# Patient Record
Sex: Male | Born: 1935 | Race: White | Hispanic: No | Marital: Married
Health system: Southern US, Community
[De-identification: ages and names within clinical notes are randomized; demographics above are authoritative.]

---

## 2004-05-24 ENCOUNTER — Ambulatory Visit: Payer: Self-pay | Admitting: Ophthalmology

## 2004-05-31 ENCOUNTER — Ambulatory Visit: Payer: Self-pay | Admitting: Ophthalmology

## 2005-05-19 ENCOUNTER — Ambulatory Visit: Payer: Self-pay | Admitting: Radiation Oncology

## 2005-11-28 ENCOUNTER — Ambulatory Visit: Payer: Self-pay | Admitting: Radiation Oncology

## 2005-12-18 ENCOUNTER — Ambulatory Visit: Payer: Self-pay | Admitting: Radiation Oncology

## 2007-07-03 ENCOUNTER — Ambulatory Visit: Payer: Self-pay | Admitting: Urology

## 2007-07-11 ENCOUNTER — Encounter (HOSPITAL_COMMUNITY): Admission: RE | Admit: 2007-07-11 | Discharge: 2007-10-09 | Payer: Self-pay | Admitting: Urology

## 2007-09-20 ENCOUNTER — Ambulatory Visit: Payer: Self-pay | Admitting: Oncology

## 2007-09-22 ENCOUNTER — Emergency Department: Payer: Self-pay | Admitting: Emergency Medicine

## 2007-09-23 ENCOUNTER — Other Ambulatory Visit: Payer: Self-pay

## 2007-10-19 ENCOUNTER — Ambulatory Visit: Payer: Self-pay | Admitting: Oncology

## 2007-11-19 ENCOUNTER — Ambulatory Visit: Payer: Self-pay | Admitting: Oncology

## 2007-12-19 ENCOUNTER — Ambulatory Visit: Payer: Self-pay | Admitting: Oncology

## 2008-01-19 ENCOUNTER — Ambulatory Visit: Payer: Self-pay | Admitting: Oncology

## 2008-02-19 ENCOUNTER — Ambulatory Visit: Payer: Self-pay | Admitting: Oncology

## 2008-04-03 ENCOUNTER — Ambulatory Visit: Payer: Self-pay | Admitting: Oncology

## 2008-04-20 ENCOUNTER — Ambulatory Visit: Payer: Self-pay | Admitting: Oncology

## 2008-05-20 ENCOUNTER — Ambulatory Visit: Payer: Self-pay | Admitting: Oncology

## 2008-06-20 ENCOUNTER — Ambulatory Visit: Payer: Self-pay | Admitting: Oncology

## 2008-07-21 ENCOUNTER — Ambulatory Visit: Payer: Self-pay | Admitting: Oncology

## 2008-08-18 ENCOUNTER — Ambulatory Visit: Payer: Self-pay | Admitting: Oncology

## 2008-09-18 ENCOUNTER — Ambulatory Visit: Payer: Self-pay | Admitting: Oncology

## 2008-10-18 ENCOUNTER — Ambulatory Visit: Payer: Self-pay | Admitting: Oncology

## 2008-11-18 ENCOUNTER — Ambulatory Visit: Payer: Self-pay | Admitting: Oncology

## 2008-12-18 ENCOUNTER — Ambulatory Visit: Payer: Self-pay | Admitting: Oncology

## 2009-01-18 ENCOUNTER — Ambulatory Visit: Payer: Self-pay | Admitting: Oncology

## 2009-01-22 ENCOUNTER — Ambulatory Visit: Payer: Self-pay | Admitting: Oncology

## 2009-02-01 ENCOUNTER — Inpatient Hospital Stay: Payer: Self-pay | Admitting: Internal Medicine

## 2009-02-18 ENCOUNTER — Ambulatory Visit: Payer: Self-pay | Admitting: Oncology

## 2009-03-20 ENCOUNTER — Ambulatory Visit: Payer: Self-pay | Admitting: Oncology

## 2009-04-06 IMAGING — NM NUCLEAR MEDICINE WHOLE BODY BONE SCINTIGRAPHY
2 series · 6 of 6 positions shown · non-contrast
Comparison: none

REASON FOR EXAM: Prostate CA
COMMENTS:

PROCEDURE:     NM  - NM BONE WB 3 HR [DATE]  [DATE]
RESULT:     The patient received an injection of 19.4 mCi of Technetium 99m
MDP. Anterior and posterior whole body images are augmented with oblique
views of the thoracolumbar region.

[Series 1000: 3 hr wholebody · 2.40mm/px · 2 of 2 frames shown]
[frame 1/2]
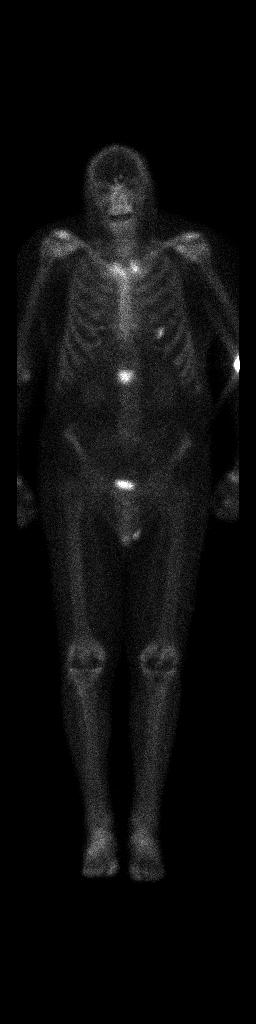
[frame 2/2]
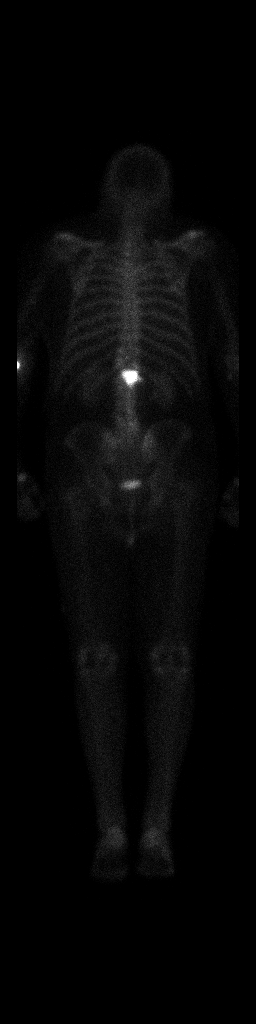

[Series 1000: statics · 2.40mm/px · 2 acquisitions, 4 frames shown]
[im 1/2]
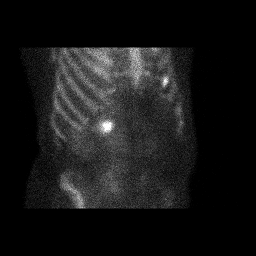
[im 1/2]
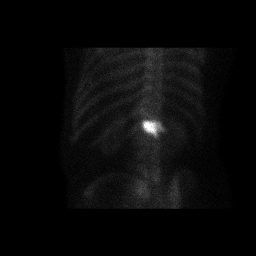
[im 2/2]
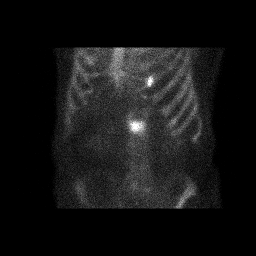
[im 2/2]
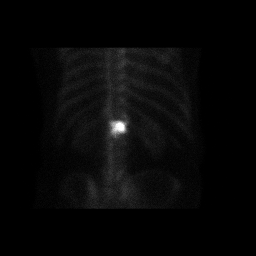

[6 of 6 positions shown; findings below may reference images not displayed]

FINDINGS: The study demonstrates increased localization in the L1 region and
in the costochondral area on the LEFT at approximately the LEFT, fifth rib.
Localization is seen in the sternoclavicular and acromioclavicular joints.
The patient appears to be status post bilateral knee arthroplasty. Uptake is
seen in the elbow, wrist, knee and ankle regions.
IMPRESSION: Abnormal localization in the L1 vertebral body region which
could be secondary to post traumatic fracture or prostate cancer metastatic
disease. There is also localization in the costovertebral region anteriorly
on the LEFT as described which is nonspecific as well. Correlation with
plain films of the lumbar spine is recommended. Degenerative changes and
post operative changes are noted in the knees with degenerative changes seen
in the ankles, shoulders, elbows and wrists.

## 2009-04-20 ENCOUNTER — Ambulatory Visit: Payer: Self-pay | Admitting: Oncology

## 2009-05-20 ENCOUNTER — Ambulatory Visit: Payer: Self-pay | Admitting: Oncology

## 2009-06-20 ENCOUNTER — Ambulatory Visit: Payer: Self-pay | Admitting: Oncology

## 2009-07-21 ENCOUNTER — Ambulatory Visit: Payer: Self-pay | Admitting: Oncology

## 2009-08-18 ENCOUNTER — Ambulatory Visit: Payer: Self-pay | Admitting: Oncology

## 2009-10-18 ENCOUNTER — Ambulatory Visit: Payer: Self-pay | Admitting: Oncology

## 2009-10-22 ENCOUNTER — Ambulatory Visit: Payer: Self-pay | Admitting: Oncology

## 2009-11-18 ENCOUNTER — Ambulatory Visit: Payer: Self-pay | Admitting: Oncology

## 2010-05-26 ENCOUNTER — Ambulatory Visit: Payer: Self-pay | Admitting: Oncology

## 2010-06-20 ENCOUNTER — Ambulatory Visit: Payer: Self-pay | Admitting: Oncology

## 2010-08-18 ENCOUNTER — Ambulatory Visit: Payer: Self-pay | Admitting: Oncology

## 2010-08-19 ENCOUNTER — Ambulatory Visit: Payer: Self-pay | Admitting: Oncology

## 2010-08-19 LAB — PSA

## 2010-08-29 ENCOUNTER — Emergency Department: Payer: Self-pay | Admitting: Emergency Medicine

## 2010-09-19 ENCOUNTER — Ambulatory Visit: Payer: Self-pay | Admitting: Oncology

## 2010-11-01 ENCOUNTER — Ambulatory Visit: Payer: Self-pay | Admitting: Oncology

## 2010-11-19 ENCOUNTER — Ambulatory Visit: Payer: Self-pay | Admitting: Oncology

## 2011-01-24 ENCOUNTER — Ambulatory Visit: Payer: Self-pay | Admitting: Oncology

## 2011-01-31 ENCOUNTER — Inpatient Hospital Stay: Payer: Self-pay | Admitting: Internal Medicine

## 2011-02-19 ENCOUNTER — Ambulatory Visit: Payer: Self-pay | Admitting: Oncology

## 2011-04-26 ENCOUNTER — Ambulatory Visit: Payer: Self-pay | Admitting: Oncology

## 2011-04-27 LAB — PSA: PSA: 3.9 ng/mL (ref 0.0–4.0)

## 2011-05-21 ENCOUNTER — Ambulatory Visit: Payer: Self-pay | Admitting: Oncology

## 2011-10-03 ENCOUNTER — Ambulatory Visit: Payer: Self-pay | Admitting: Oncology

## 2011-10-03 LAB — CBC CANCER CENTER
Eosinophil #: 0.1 x10 3/mm (ref 0.0–0.7)
Eosinophil %: 2 %
Lymphocyte %: 8.2 %
MCH: 28 pg (ref 26.0–34.0)
MCV: 88 fL (ref 80–100)
Neutrophil #: 4.4 x10 3/mm (ref 1.4–6.5)
Platelet: 164 x10 3/mm (ref 150–440)
RDW: 15.5 % — ABNORMAL HIGH (ref 11.5–14.5)
WBC: 5.5 x10 3/mm (ref 3.8–10.6)

## 2011-10-03 LAB — COMPREHENSIVE METABOLIC PANEL
Alkaline Phosphatase: 86 U/L (ref 50–136)
Chloride: 100 mmol/L (ref 98–107)
EGFR (African American): 38 — ABNORMAL LOW
EGFR (Non-African Amer.): 32 — ABNORMAL LOW
Osmolality: 285 (ref 275–301)
SGOT(AST): 16 U/L (ref 15–37)
SGPT (ALT): 18 U/L
Total Protein: 7 g/dL (ref 6.4–8.2)

## 2011-10-03 LAB — URINALYSIS, COMPLETE
Bacteria: NONE SEEN
Glucose,UR: NEGATIVE mg/dL (ref 0–75)
Hyaline Cast: 1
Ketone: NEGATIVE
Ph: 5 (ref 4.5–8.0)
Protein: NEGATIVE
Specific Gravity: 1.005 (ref 1.003–1.030)
Squamous Epithelial: NONE SEEN

## 2011-10-04 LAB — PSA: PSA: 0.5 ng/mL (ref 0.0–4.0)

## 2011-10-19 ENCOUNTER — Ambulatory Visit: Payer: Self-pay | Admitting: Oncology

## 2012-05-09 ENCOUNTER — Inpatient Hospital Stay: Payer: Self-pay | Admitting: Internal Medicine

## 2012-05-09 LAB — URINALYSIS, COMPLETE
Bacteria: NONE SEEN
Protein: NEGATIVE
Specific Gravity: 1.003 (ref 1.003–1.030)
Squamous Epithelial: NONE SEEN
WBC UR: NONE SEEN /HPF (ref 0–5)

## 2012-05-09 LAB — CBC
HCT: 33.9 % — ABNORMAL LOW (ref 40.0–52.0)
HGB: 11.2 g/dL — ABNORMAL LOW (ref 13.0–18.0)
RBC: 3.93 10*6/uL — ABNORMAL LOW (ref 4.40–5.90)
WBC: 7.9 10*3/uL (ref 3.8–10.6)

## 2012-05-09 LAB — COMPREHENSIVE METABOLIC PANEL
BUN: 30 mg/dL — ABNORMAL HIGH (ref 7–18)
Bilirubin,Total: 0.4 mg/dL (ref 0.2–1.0)
Chloride: 97 mmol/L — ABNORMAL LOW (ref 98–107)
Co2: 27 mmol/L (ref 21–32)
EGFR (Non-African Amer.): 30 — ABNORMAL LOW
Potassium: 4 mmol/L (ref 3.5–5.1)
SGOT(AST): 23 U/L (ref 15–37)
Total Protein: 7.1 g/dL (ref 6.4–8.2)

## 2012-05-09 LAB — TROPONIN I: Troponin-I: <0.02 ng/mL

## 2012-05-10 LAB — LIPID PANEL
Cholesterol: 99 mg/dL (ref 0–200)
Ldl Cholesterol, Calc: 42 mg/dL (ref 0–100)
Triglycerides: 92 mg/dL (ref 0–200)

## 2012-06-02 IMAGING — CT CT HEAD WITHOUT CONTRAST
2 series · 16 of 30 positions shown, 20 images · non-contrast
Comparison: none

REASON FOR EXAM: halucinations ams
COMMENTS:

[Series 2: without · axial · non-contrast · 0.44mm/px · z∈[+17,+142]mm · 13 of 31 slices shown, 17 images]
[im 3/31  brain]
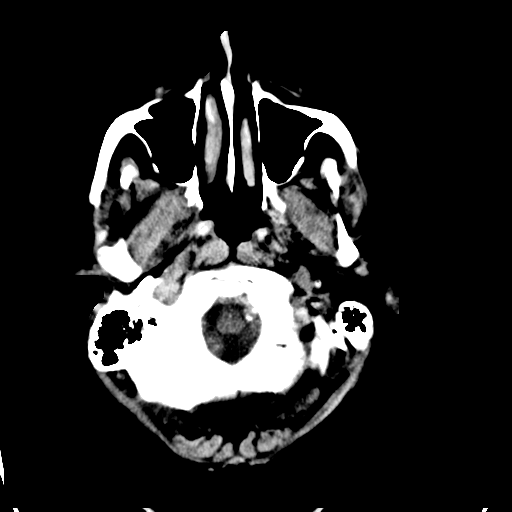
[im 3/31  bone]
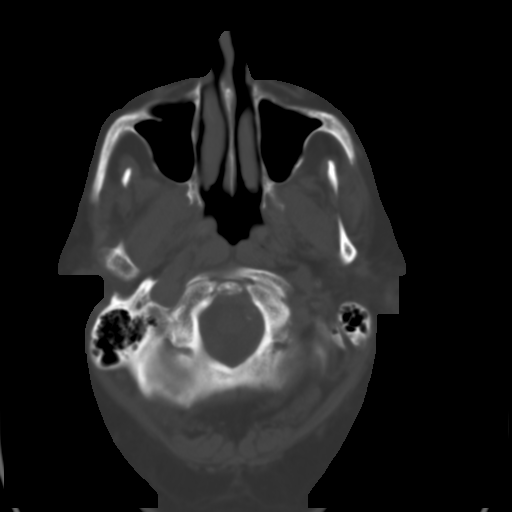
[im 5/31  brain]
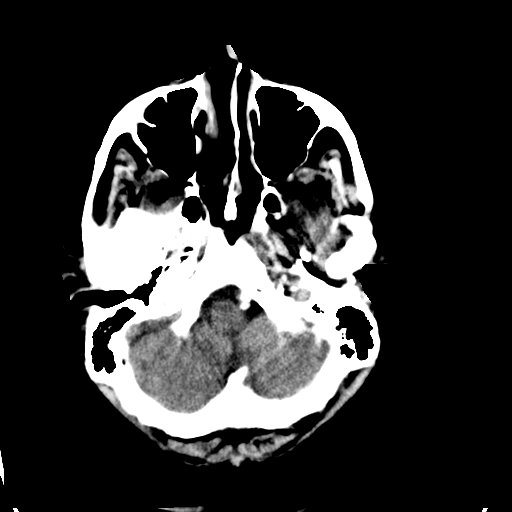
[im 7/31  brain]
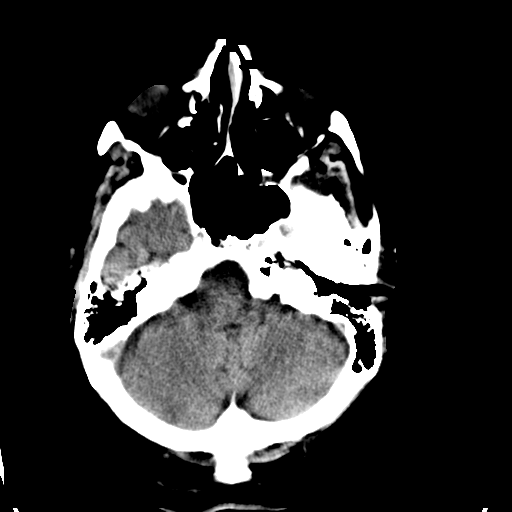
[im 9/31  brain]
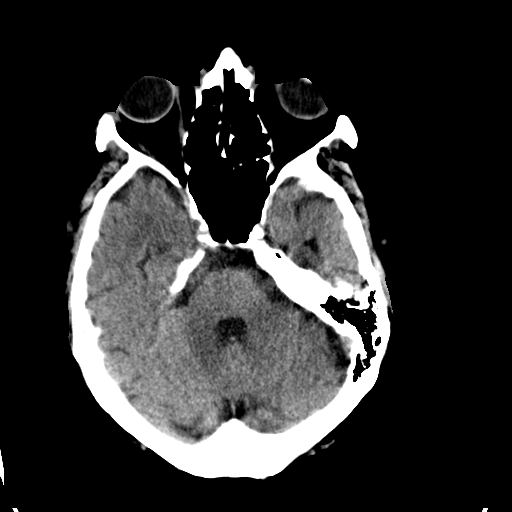
[im 11/31  brain]
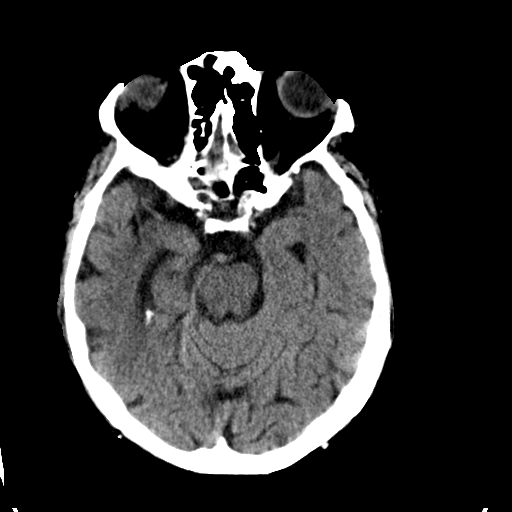
[im 11/31  bone]
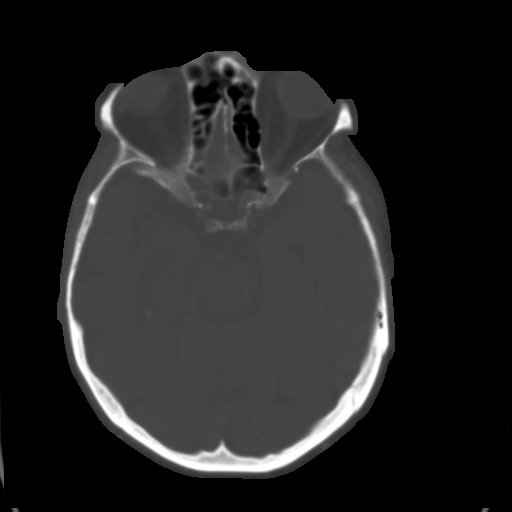
[im 13/31  brain]
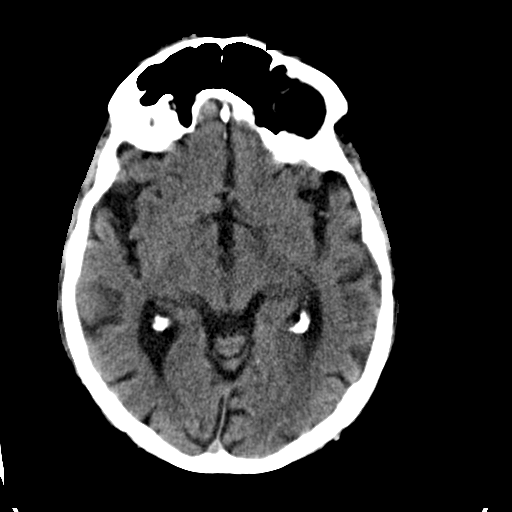
[im 16/31  brain]
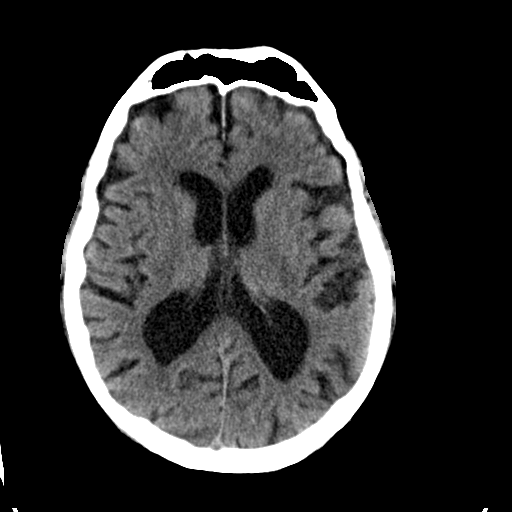
[im 18/31  brain]
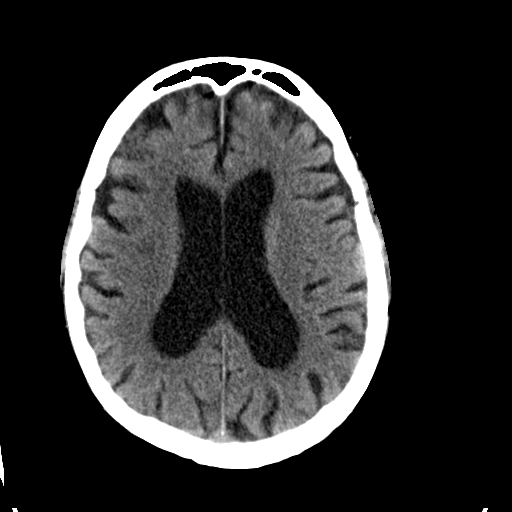
[im 20/31  brain]
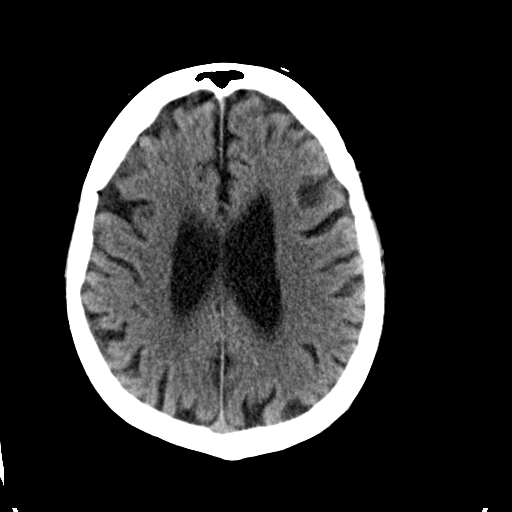
[im 20/31  bone]
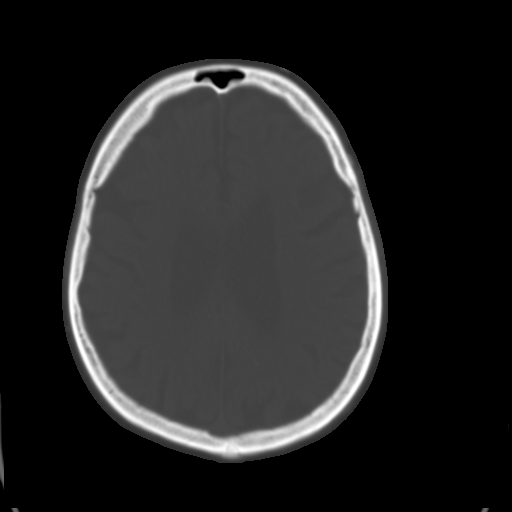
[im 22/31  brain]
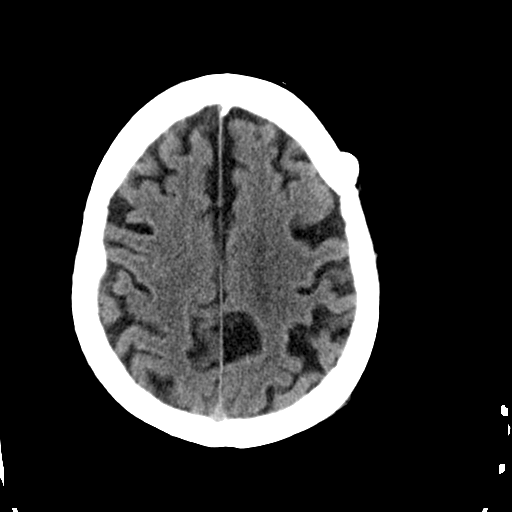
[im 24/31  brain]
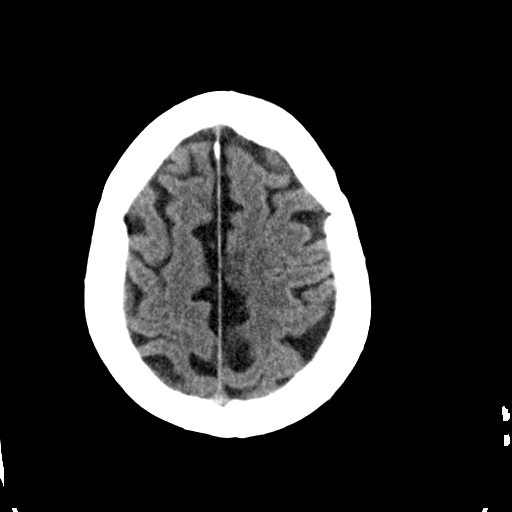
[im 26/31  brain]
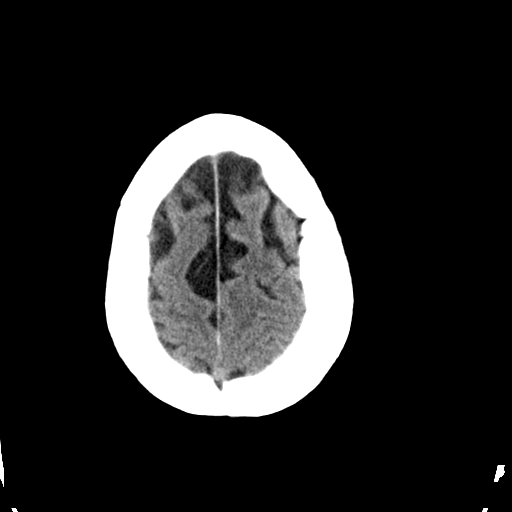
[im 28/31  brain]
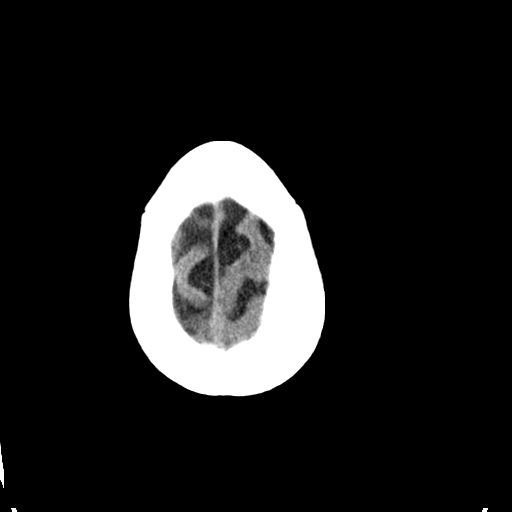
[im 28/31  bone]
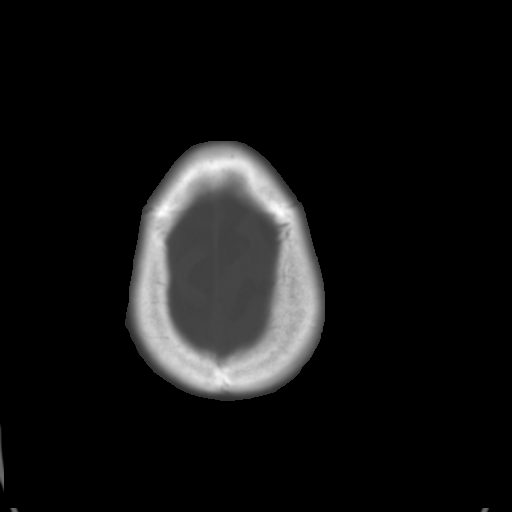

[Series 3: bone · axial · 0.44mm/px · z∈[+17,+57]mm · 3 of 31 slices shown]
[im 3/31  bone]
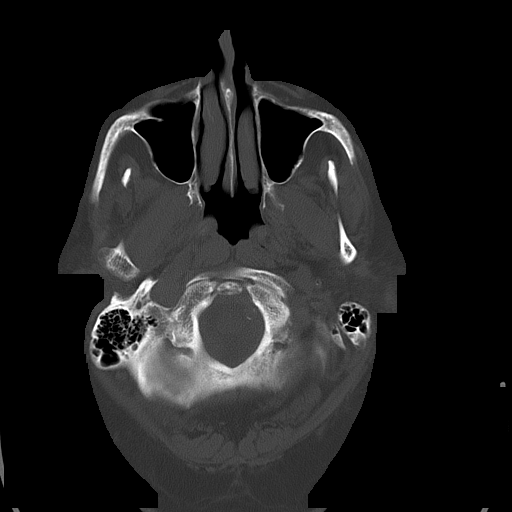
[im 7/31  bone]
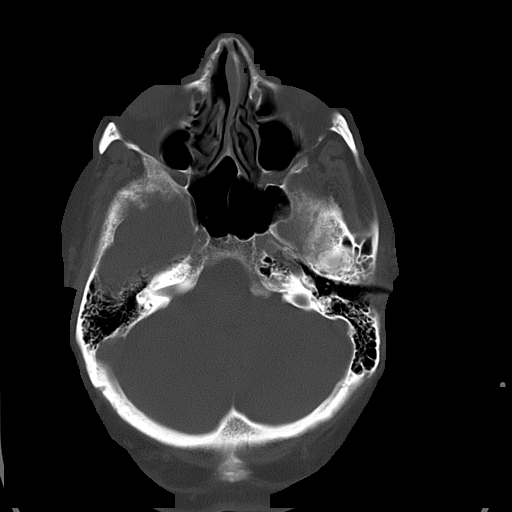
[im 11/31  bone]
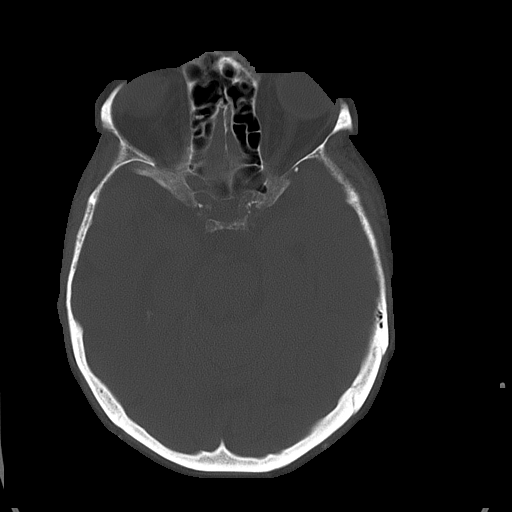

[16 of 30 positions shown; findings below may reference images not displayed]

PROCEDURE:     CT  - CT HEAD WITHOUT CONTRAST  - August 29, 2010 [DATE]

RESULT:     Axial noncontrast CT scanning was performed through the brain at
5 mm intervals and slice thicknesses.

There is mild diffuse cerebral and cerebellar atrophy with compensatory
ventriculomegaly. There is no intracranial hemorrhage. There is no evidence
of an evolving ischemic infarction. At bone window settings the observed
portions of the paranasal sinuses and mastoid air cells are clear. There is
an exostosis from the posterior aspect of the left frontal bone.
IMPRESSION: 1. I do not see evidence of an acute ischemic or hemorrhagic infarction.
2. There is no evidence of hydrocephalus or intracranial mass effect.
3. There is mild age-appropriate diffuse cerebral and cerebellar atrophy.

## 2012-09-17 ENCOUNTER — Inpatient Hospital Stay: Payer: Self-pay | Admitting: Internal Medicine

## 2012-09-17 LAB — URINALYSIS, COMPLETE
Glucose,UR: >=500 mg/dL (ref 0–75)
Ph: 5 (ref 4.5–8.0)
Protein: NEGATIVE
RBC,UR: 1 /HPF (ref 0–5)
Squamous Epithelial: NONE SEEN
WBC UR: <1 /HPF (ref 0–5)

## 2012-09-17 LAB — COMPREHENSIVE METABOLIC PANEL
Alkaline Phosphatase: 86 U/L (ref 50–136)
Anion Gap: 7 (ref 7–16)
BUN: 33 mg/dL — ABNORMAL HIGH (ref 7–18)
Bilirubin,Total: 0.7 mg/dL (ref 0.2–1.0)
Chloride: 97 mmol/L — ABNORMAL LOW (ref 98–107)
Co2: 26 mmol/L (ref 21–32)
EGFR (African American): 42 — ABNORMAL LOW
EGFR (Non-African Amer.): 37 — ABNORMAL LOW
Potassium: 5.2 mmol/L — ABNORMAL HIGH (ref 3.5–5.1)
SGOT(AST): 42 U/L — ABNORMAL HIGH (ref 15–37)
SGPT (ALT): 21 U/L (ref 12–78)
Total Protein: 7.3 g/dL (ref 6.4–8.2)

## 2012-09-17 LAB — CBC
HCT: 37.7 % — ABNORMAL LOW (ref 40.0–52.0)
HGB: 12.6 g/dL — ABNORMAL LOW (ref 13.0–18.0)
MCHC: 33.6 g/dL (ref 32.0–36.0)
MCV: 87 fL (ref 80–100)
Platelet: 160 10*3/uL (ref 150–440)
RDW: 15.3 % — ABNORMAL HIGH (ref 11.5–14.5)
WBC: 6.7 10*3/uL (ref 3.8–10.6)

## 2012-09-17 LAB — PROTIME-INR: Prothrombin Time: 13.8 secs (ref 11.5–14.7)

## 2012-09-17 LAB — TROPONIN I: Troponin-I: <0.02 ng/mL

## 2012-09-17 LAB — APTT: Activated PTT: 30.2 secs (ref 23.6–35.9)

## 2012-09-17 LAB — LIPASE, BLOOD: Lipase: 220 U/L (ref 73–393)

## 2012-09-17 LAB — TSH: Thyroid Stimulating Horm: 3.72 u[IU]/mL

## 2012-09-18 LAB — CBC WITH DIFFERENTIAL/PLATELET
Eosinophil %: 1.9 %
HGB: 12.4 g/dL — ABNORMAL LOW (ref 13.0–18.0)
Lymphocyte %: 17.6 %
MCH: 28.7 pg (ref 26.0–34.0)
MCHC: 33.2 g/dL (ref 32.0–36.0)
Neutrophil #: 4.6 10*3/uL (ref 1.4–6.5)
Neutrophil %: 68.7 %
RBC: 4.33 10*6/uL — ABNORMAL LOW (ref 4.40–5.90)
WBC: 6.7 10*3/uL (ref 3.8–10.6)

## 2012-09-18 LAB — COMPREHENSIVE METABOLIC PANEL
Albumin: 3.5 g/dL (ref 3.4–5.0)
Alkaline Phosphatase: 76 U/L (ref 50–136)
Anion Gap: 9 (ref 7–16)
Calcium, Total: 8.3 mg/dL — ABNORMAL LOW (ref 8.5–10.1)
Co2: 26 mmol/L (ref 21–32)
Creatinine: 1.6 mg/dL — ABNORMAL HIGH (ref 0.60–1.30)
EGFR (African American): 43 — ABNORMAL LOW
Glucose: 199 mg/dL — ABNORMAL HIGH (ref 65–99)
Osmolality: 282 (ref 275–301)
SGPT (ALT): 19 U/L (ref 12–78)

## 2012-09-18 LAB — MAGNESIUM: Magnesium: 2.1 mg/dL

## 2012-09-19 LAB — CBC WITH DIFFERENTIAL/PLATELET
Eosinophil #: 0.2 10*3/uL (ref 0.0–0.7)
Eosinophil %: 2.9 %
HGB: 10.9 g/dL — ABNORMAL LOW (ref 13.0–18.0)
Lymphocyte #: 0.6 10*3/uL — ABNORMAL LOW (ref 1.0–3.6)
MCH: 28.1 pg (ref 26.0–34.0)
Monocyte #: 0.5 x10 3/mm (ref 0.2–1.0)
Monocyte %: 9.7 %
Neutrophil #: 3.9 10*3/uL (ref 1.4–6.5)
Neutrophil %: 75.1 %
Platelet: 127 10*3/uL — ABNORMAL LOW (ref 150–440)
RBC: 3.88 10*6/uL — ABNORMAL LOW (ref 4.40–5.90)

## 2012-09-19 LAB — BASIC METABOLIC PANEL
BUN: 24 mg/dL — ABNORMAL HIGH (ref 7–18)
Chloride: 107 mmol/L (ref 98–107)
Creatinine: 1.42 mg/dL — ABNORMAL HIGH (ref 0.60–1.30)
EGFR (African American): 49 — ABNORMAL LOW
EGFR (Non-African Amer.): 43 — ABNORMAL LOW
Osmolality: 282 (ref 275–301)
Sodium: 139 mmol/L (ref 136–145)

## 2012-09-19 LAB — URINE CULTURE

## 2012-09-23 LAB — CULTURE, BLOOD (SINGLE)

## 2012-10-20 ENCOUNTER — Emergency Department: Payer: Self-pay | Admitting: Emergency Medicine

## 2013-02-01 ENCOUNTER — Emergency Department: Payer: Self-pay | Admitting: Emergency Medicine

## 2013-02-01 LAB — CBC WITH DIFFERENTIAL/PLATELET
Eosinophil #: 0.1 10*3/uL (ref 0.0–0.7)
HCT: 30 % — ABNORMAL LOW (ref 40.0–52.0)
HGB: 9.9 g/dL — ABNORMAL LOW (ref 13.0–18.0)
Lymphocyte #: 0.8 10*3/uL — ABNORMAL LOW (ref 1.0–3.6)
MCH: 26.4 pg (ref 26.0–34.0)
Monocyte %: 8.4 %
Neutrophil #: 4.8 10*3/uL (ref 1.4–6.5)
Neutrophil %: 76.2 %
Platelet: 219 10*3/uL (ref 150–440)
WBC: 6.3 10*3/uL (ref 3.8–10.6)

## 2013-02-01 LAB — BASIC METABOLIC PANEL
BUN: 37 mg/dL — ABNORMAL HIGH (ref 7–18)
Chloride: 103 mmol/L (ref 98–107)
Creatinine: 1.58 mg/dL — ABNORMAL HIGH (ref 0.60–1.30)
Potassium: 4.8 mmol/L (ref 3.5–5.1)

## 2013-02-01 LAB — URINALYSIS, COMPLETE
Bilirubin,UR: NEGATIVE
Ketone: NEGATIVE
RBC,UR: 2 /HPF (ref 0–5)

## 2013-03-20 DEATH — deceased

## 2014-10-07 NOTE — Discharge Summary (Signed)
PATIENT NAME:  Kevin Schmidt, Kevin Schmidt MR#:  [id number] DATE OF BIRTH:  1918/08/25  DATE OF ADMISSION:  05/09/2012 DATE OF DISCHARGE:  05/11/2012  HISTORY OF PRESENT ILLNESS: Kevin Schmidt is a 81 year old white gentleman who presented to the Emergency Room with confusion, headache, and visual disturbance. In the Emergency Room, a head CT showed a possible acute cerebrovascular accident in the posterior cerebral circulation. The patient was, therefore, admitted.   PAST MEDICAL HISTORY:  Notable for type 2 diabetes, coronary artery disease, a history of chronic atrial fibrillation, carcinoma of the prostate, spinal stenosis, macular degeneration, a history of peptic ulcer disease, a history of colon cancer, obesity, B12 deficiency, hyperlipidemia, chronic renal insufficiency, hypothyroidism, aortic stenosis.   PAST SURGICAL HISTORY: Previous CABG, angioplasty, appendectomy, bilateral knee replacements and colectomy.   ADMISSION MEDICATIONS: Flomax 0.4 mg daily, iron 45 mg daily, glyburide 6 mg daily, simvastatin 40 mg at bedtime, metoprolol 50 mg daily and hydrocodone 7.5/500, 1 to 2 tablets every 6 hours as needed for pain.   PHYSICAL EXAMINATION: The patient's admitting history and physical exam as described by the admitting physician revealed:  VITAL SIGNS:  Pulse of 88 and irregular, respirations 20, and blood pressure 133/77.  GENERAL: The patient was alert and oriented x3.  HEENT: He was noted to be legally blind due to macular degeneration.  NECK: There were no carotid bruits.  LUNGS: Breath sounds were clear.  HEART: He did have an irregular rhythm of atrial fibrillation. There were no murmurs.  NEUROLOGICAL: He was noted to have increased loss of vision out of the right eye as compared to the left.  The remainder of the exam was relatively unremarkable.   LABORATORY, DIAGNOSTIC AND RADIOLOGIC DATA: The patient's admission CBC showed a hemoglobin of 11.2 with a hematocrit of 33.9. Admission  comprehensive metabolic panel showed a random blood sugar of 226, a BUN of 30, a creatinine of 1.92, a sodium of 132, a chloride of 97, a calcium of 8.4, an albumin of 3.1 and an estimated GFR of 30 but was otherwise unremarkable. Lipid panel showed triglycerides to be 92.0. LDL cholesterol was 42. Admission urinalysis showed 1+ blood on the dipstick, but the microscopic was unremarkable.   Admission EKG showed atrial fibrillation with bifascicular block. Admission chest x-ray showed cardiomegaly with possible early congestive heart failure. Carotid ultrasound showed atherosclerotic disease without evidence of significant stenosis. Subsequent MRI showed findings consistent with acute infarction at the right PCA distribution.   HOSPITAL COURSE: The patient was admitted to the regular floor where he was placed on telemetry. As an outpatient, he had been on aspirin alone due to his history of peptic ulcer disease and previous GI bleed. He was not felt to be a good candidate for warfarin. He was seen in consultation by Physical Therapy, who recommended discharge home with home physical therapy. At the time of discharge, the patient actually was doing relatively well and was anxious to be discharged, as was his family. He was, however, complaining of headache at the time of discharge most likely due to the initiation of Aggrenox.   DISCHARGE DIAGNOSES: 1. Acute cerebrovascular accident in the right posterior cerebral artery distribution.  2. Chronic atrial fibrillation.  3. Coronary artery disease.  4. Type 2 diabetes.  5. Macular degeneration.   DISCHARGE MEDICATIONS: The patient was discharged on his preadmission medications without change with the exception of the addition of Aggrenox 1 tablet b.i.d.   DISPOSITION: He was discharged home with home health and home  physical therapy.   FOLLOWUP: The patient is to be followed up in the office in 1 to 2 weeks.  ____________________________ Letta PateJohn B. Danne HarborWalker  III, MD jbw:cb D: 06/04/2012 06:14:26 ET T: 06/04/2012 14:50:15 ET JOB#: [id number]  cc: Jonny RuizJohn B. Danne HarborWalker III, MD, <Dictator> Elmo PuttJOHN B WALKER III MD ELECTRONICALLY SIGNED 06/05/2012 8:02

## 2014-10-07 NOTE — H&P (Signed)
PATIENT NAME:  Kevin Schmidt, Kevin A MR#:  [id number] DATE OF BIRTH:  1919/04/16  DATE OF ADMISSION:  05/09/2012  PRIMARY CARE PHYSICIAN: Yates DecampJohn Walker, MD    CHIEF COMPLAINT: Disorientation, worsening vision in right eye, and headache.   HISTORY OF PRESENT ILLNESS: Kevin Schmidt is a very pleasant 81 year old Caucasian gentleman with past medical history of chronic atrial fibrillation on aspirin, history of macular degeneration, chronic anemia, hyperlipidemia, and type II diabetes who comes to the Emergency Room accompanied by family members with the above-mentioned chief complaint. The patient has history of macular degeneration of both eyes worse on the left, however, he started noticing difficulty with vision in the right eye last night along with some disorientation where he would forget how to dial a phone, getting the correct words out as noted by family members. There was no dysarthria, difficulty swallowing, or any focal weakness noted. He also complained of some neck pain and sinus congestion. In the ED CT scan showed right posterior cerebral circulation CVA. The patient is being admitted for further evaluation and management.   PAST MEDICAL HISTORY:  1. Type II diabetes.  2. Heart disease with bypass in 1999.  3. History of chronic atrial fibrillation, on aspirin.  4. Carcinoma of the prostate, followed by Dr. Rushie Chestnuthrystal and Dr. Orlie DakinFinnegan.  5. History of spinal stenosis.  6. Bilateral macular degeneration, patient is legally blind.  7. Peptic ulcer disease.  8. Carcinoma of the colon.  9. Obesity.  10. Vitamin B12 deficiency.  11. Hyperlipidemia.  12. CKD, stage III.  13. Hypothyroidism.  14. History of aortic stenosis per echo February 2013.   PAST SURGICAL HISTORY:  1. Status post CABG x3.  2. [facility].  3. Appendectomy.  4. Bilateral total knee replacement.  5. Arthroscopic surgery on the left knee.  6. Colectomy.   HOME MEDICATIONS:  1. Tamsulosin  0.4 mg once a day.  2. Iron 45 mg 1 tablet daily.  3. Glyburide micronized tablet 6 mg p.o. daily.  4. Simvastatin 40 mg at bedtime.  5. Metoprolol 50 mg p.o. daily.  6. Hydrocodone 7.5 mg/500 mg 1 to 2 q.6 p.r.n.   FAMILY HISTORY: Positive for hypertension, diabetes, and heart disease.   SOCIAL HISTORY: Lives at home by himself. Uses a cane while walking. Drinks alcohol socially.   REVIEW OF SYSTEMS: CONSTITUTIONAL: No fever, fatigue, weakness. EYES: Positive for decreased vision in the right eye. Positive for macular degeneration. ENT: No tinnitus or ear pain. Positive for sinusitis. RESPIRATORY: No cough, wheeze, hemoptysis, or COPD. CARDIOVASCULAR: No chest pain, orthopnea, edema. Positive for hypertension. GI: No nausea, vomiting, diarrhea, abdominal pain, or GERD. GU: No dysuria or hematuria. ENDOCRINE: No polyuria or nocturia. HEMATOLOGY: No anemia or easy bruising. SKIN: No acne or rash. MUSCULOSKELETAL: Positive for arthritis. NEUROLOGIC: Positive for generalized weakness along with right eye vision loss. PSYCHIATRIC: No anxiety or depression. All other systems reviewed and negative.   PHYSICAL EXAMINATION:   GENERAL: The patient is awake, alert, oriented x3 not in acute distress.   VITAL SIGNS: Afebrile, pulse 88, irregular, respirations 20, blood pressure 133/77, sats 100% on 2 liters nasal cannula oxygen.   HEENT: Atraumatic, normocephalic. Pupils equal, round, and reactive to light and accommodation. Extraocular movements intact. Oral mucosa is moist. No facial droop.   NECK: Supple. No JVD. No carotid bruit.   RESPIRATORY: Clear to auscultation bilaterally. Decreased breath sounds in the bases. No murmur heard. PMI not lateralized. Chest nontender. Good  pedal pulses. Good femoral pulses. Trace lower extremity edema.   ABDOMEN: Obese, soft, nontender. No organomegaly. Positive bowel sounds.   NEUROLOGIC: The patient is right-handed. Speech is clear. No facial droop. The  patient has right eye loss of vision. Could not do visual acuity or detailed eye exam given the patient's macular degeneration. Strength 4+/5 in all four extremities. Sensory exam normal. Reflexes are 1+ in both upper and lower extremities. Plantars are downgoing.   SKIN: Warm and dry.   PSYCH: The patient is awake, alert, oriented x3.   LABORATORY, DIAGNOSTIC, AND RADIOLOGICAL DATA: Chest x-ray cardiomegaly with interstitial infiltrate.   CBC within normal limits except hemoglobin of 11.2 and hematocrit of 33.9, glucose 226, BUN 30, creatinine 1.9, sodium 132, potassium 4, chloride 97, bicarb 27, albumin 3.1. PT-INR 14.6 and 1.1.   CT of the head without contrast shows findings consistent with area of subacute infarction involving the right posterior cerebral arterial distribution. Chronic and involutional changes as described above.   ASSESSMENT: 81 year old Mr. Kevin Schmidt with history of hypertension, type II diabetes, hyperlipidemia, and chronic atrial fibrillation presents with:  1. Acute right posterior cerebral CVA. The patient presented with worsening right eye vision, disorientation, and headache. He has history of atrial fibrillation on aspirin 81 mg. He takes 2 tablets daily. Will admit the patient to telemetry floor. I will change his aspirin to Aggrenox 1 tablet b.i.d. Continue to monitor neuro checks. Get MRI and carotid Doppler in the morning. The patient had echo of the heart done February 2013 as outpatient and showed EF of 45 to 50% with moderate aortic stenosis. If symptoms worsen, consider Neurology consultation.  2. PT, OT, and Care Management for discharge planning in the setting of stroke.  3. Hypertension. Will continue Lopressor however, will allow permissive hypertension.  4. Chronic atrial fibrillation, on aspirin.  5. Legally blind in both eyes due to macular degeneration.  6. Type II diabetes. Will continue glyburide and sliding scale. 7. History of prostate cancer with  mets. The patient is currently not on any medications. He was on Lupron and Zometa in the past.   8. DVT prophylaxis with heparin sub- Q.  9. Further work-up according to the patient's clinical course.   Hospital admission plan was discussed with the patient and the patient's daughter-in-law.   TIME SPENT: 50 minutes.   ____________________________ Wylie Hail Allena Katz, MD sap:drc D: 05/09/2012 17:21:54 ET T: 05/10/2012 06:57:23 ET JOB#: [id number]  cc: Gibson Lad A. Allena Katz, MD, <Dictator> John B. Danne Harbor, MD Willow Ora MD ELECTRONICALLY SIGNED 05/10/2012 20:12

## 2014-10-10 NOTE — Discharge Summary (Signed)
PATIENT NAME:  Kevin Schmidt, Kevin Schmidt MR#:  [id number] DATE OF BIRTH:  Jun 08, 1919  DATE OF ADMISSION:  09/17/2012 DATE OF DISCHARGE:  09/20/2012  HISTORY OF PRESENT ILLNESS:  Kevin Schmidt is a 81 year old white single gentleman who was brought to the Emergency Room by his family because of failure to thrive. The patient could no longer get up to walk to get to his meals or the bathroom. He had had a recent CVA in November of 2013 which had left him with very little residual at the time, although he did have a mild vascular dementia. Unfortunately, since he went home, he has gotten progressively worse in terms of his mental status. Evaluation in the Emergency Room revealed he was oriented to person only. Laboratory work done in the ER showed evidence of dehydration secondary to failure to thrive. The patient was therefore admitted for further evaluation and treatment.   PAST MEDICAL HISTORY:  Revealed he had known coronary artery disease with a previous bypass procedure done in 1999. He had a long history of chronic atrial fibrillation, but was not on Coumadin due to risk of falls and a previous history of peptic ulcer disease. The patient also had known type 2 diabetes. He had a history of prostate cancer that had been followed by Dr. Aggie Cosier in the past. Other diagnoses included lumbar spinal stenosis, macular degeneration, peptic ulcer disease, B12 deficiency, hyperlipidemia, chronic renal insufficiency, hypothyroidism and valvular heart disease.   PAST SURGICAL HISTORY: Included a previous CABG procedure. He had also had an angioplasty 15 to 20 years ago. Other surgeries included a previous appendectomy, colectomy, bilateral knee replacements, and previous arthroscopic surgery on his left knee.   ALLERGIES:  No known drug allergies.   MEDICATIONS:  At home included Ecotrin 81 mg 2 tablets daily, Flomax 0.4 mg daily, simvastatin 40 mg at bedtime, Plavix 75 mg daily, citalopram 20 mg daily, Toprol-XL 50 mg daily  and glipizide 10 mg each morning.   ADMISSION PHYSICAL EXAMINATION:  Revealed a temperature of 97.6, a pulse of 74 and blood pressure of 180/85. In general, he was an elderly gentleman who looked both acutely and chronically ill. Skin turgor was poor. There was no lymphadenopathy. He had multiple senile ecchymoses. Funduscopic exam revealed arterial narrowing, but no hemorrhages or exudates. The mucous membranes were dry. The neck was supple. There is an increased AP diameter to the chest. The lungs were clear. Cardiac exam revealed an irregular rhythm with a grade 2/6 systolic ejection murmur. There were no gallops. His abdomen was soft and nontender. The extremities showed no edema and he did have generalized changes of osteoarthritis. Neurological exam showed the patient to be oriented to person only. No focal neurological findings were noted.   LABORATORY AND DIAGNOSTICS:  The patient's EKG shows atrial fibrillation with controlled ventricular response. Admission CBC showed a hemoglobin of 12 with a white count of 5700. Platelet count was 160,000. Comprehensive metabolic panel showed a random blood sugar of 410. BUN was 33 with a creatinine of 1.61. Potassium was 5.2. Estimated GFR was 37. Magnesium was 1.7. Urinalysis was unremarkable.   HOSPITAL COURSE: The patient was admitted to the regular medical floor where he was placed on telemetry. He was rehydrated with IV fluids and his electrolyte imbalances were corrected. He was continued on his oral medication for his diabetes. He was covered with sliding scale until his mental status improved. The patient was seen in evaluation by both physical therapy and speech therapy. Physical therapy recommended  rehab. The patient's strength improved while he was in the hospital. He did remain confused, however, although he was pleasant. He did have times of agitation requiring p.r.n. Haldol. The patient's final CBC prior to transfer showed a hemoglobin of 10.9 with a  hematocrit of 33.1. White count was 5200. Platelet count was 127,000. His final basic metabolic panel showed a blood sugar of 109. BUN was 24 with a creatinine 1.42. Calcium was 7.7. Estimated GFR is 43.   DISCHARGE DIAGNOSES: 1.  Acute metabolic encephalopathy secondary to dehydration.  2.  Acute renal failure secondary to dehydration.  3.  Senile failure to thrive.  4.  Coronary artery disease.  5.  Chronic atrial fibrillation.  6.  Type 2 diabetes.   DISCHARGE MEDICATIONS: 1.  Acetaminophen 650 mg every 4 hours as needed for pain or fever.  2.  Enteric-coated aspirin 162 mg daily.  3.  Citalopram 20 mg daily.  4.  Plavix 75 mg daily.  5.  Glipizide 10 mg each morning.  6.  Metoprolol succinate 50 mg daily.  7.  Simvastatin 20 mg at bedtime.  8.  Flomax 0.4 mg daily.  9.  Haldol 5 mg every 6 hours p.r.n. agitation.   DISCHARGE DISPOSITION: The patient is being discharged on an 1800 calorie ADA diet. He is to have activity as tolerated. He will continue to receive physical therapy and rehab.   The patient has a Living Will and is a NO CODE.  ____________________________ Letta PateJohn B. Danne HarborWalker III, MD jbw:sb D: 09/20/2012 07:48:50 ET T: 09/20/2012 08:05:28 ET JOB#: [id number]  cc: Letta PateJohn B. Danne HarborWalker III, MD, <Dictator> Elmo PuttJOHN B WALKER III MD ELECTRONICALLY SIGNED 09/23/2012 16:29

## 2014-10-10 NOTE — H&P (Signed)
PATIENT NAME:  Kevin Schmidt, Kevin Schmidt MR#:  [id number] DATE OF BIRTH:  Feb 27, 1919  DATE OF ADMISSION:  09/17/2012  HISTORY OF PRESENT ILLNESS:   The patient is a 81 year old white single gentleman who was brought to the Emergency Room by his family because of failure to thrive, and the fact they could no longer get him up to walk or to get to his meals or the bathroom. The patient had a known recent CVA in November that left him with very little residual, although he did have some mild dementia. Unfortunately, since he went home, he has become progressively worse in terms of his mental status. Evaluation in the Emergency Room revealed he was oriented to person only. Laboratory work shows evidence of dehydration secondary to failure to thrive. The patient has acute renal failure as a result of the former.   PAST MEDICAL HISTORY: Reveals that he has known coronary artery disease with a previous bypass procedure done in 1999. He has a long history of chronic atrial fibrillation, but has not been on Coumadin due to a risk of falls. The patient has a history of type 2 diabetes. He has a history of cancer of the prostate that has been followed in the past by Dr. Aggie Cosier. He has known spinal stenosis, macular degeneration, peptic ulcer disease, B12 deficiency, hyperlipidemia, chronic renal insufficiency, hypothyroidism and valvular heart disease.   PAST SURGICAL HISTORY:  Includes his previous CABG.  He had an angioplasty approximately 15 to 20 years ago.  He has had an appendectomy, colectomy, bilateral knee replacements and arthroscopic surgery on his left knee.   ALLERGIES:  No known drug allergies.   MEDICATIONS:  At home have been enteric-coated aspirin 81 mg 2 tablets daily, Flomax 0.4 mg daily, simvastatin 40 mg at bedtime, Plavix 75 mg daily, citalopram 20 mg daily, Toprol-XL 50 mg daily and glipizide 10 mg each morning.   FAMILY HISTORY:  Noncontributory.   SOCIAL HISTORY:  Reveals that he has been living  alone, although his family has stayed with him almost 24/7 to try and keep him supported. The patient does not have any history of tobacco or recreational drugs.   REVIEW OF SYSTEMS:   The patient cannot provide due to his demented state.   ADMISSION PHYSICAL EXAMINATION:  VITAL SIGNS:  Revealed a temperature 97.6, pulse of 74 and blood pressure of 180/85.  GENERAL:  This is an elderly gentleman who appears both acutely and chronically ill.  SKIN:  Turgor is poor. There is no lymphadenopathy. He has multiple senile ecchymoses.  HEAD, EARS, EYES, NOSE AND THROAT:  Was most notable for arterial narrowing on funduscopic exam. The mucous membranes are extremely dry.  NECK:  Supple.  Thyroid was not palpable. There was no JVD. There were no carotid bruits. CHEST/LUNGS:  There is an increased AP diameter to the chest. The lung fields were clear to auscultation and percussion. No rales, rhonchi or wheezes.  CARDIAC:  Exam reveals an irregular rhythm with a grade 2/6 systolic ejection murmur. There are no diastolic murmurs. There are no gallops.  ABDOMEN:  Soft and nontender. Liver and spleen are not enlarged. Bowel sounds were normal.  GENITAL/RECTAL:  Exams were deferred.  EXTREMITIES:  Show no edema. He has generalized changes of osteoarthritis.  NEUROLOGIC:  Exam showed the patient to be oriented to person only. No focal neurological findings were noted.   LABORATORY AND DIAGNOSTICS:  EKG shows atrial fibrillation with a controlled ventricular response. CBC shows a hemoglobin of  12, a white count of 5700 and a platelet count of 160,000. Comprehensive metabolic panel is most notable for a blood sugar of 410, a BUN of 33 with a creatinine 1.61, potassium of 5.2, and an estimated GFR of 37 and a magnesium of 1.7. Urinalysis is unremarkable.   IMPRESSIONS: 1.  Acute renal failure secondary to dehydration.  2.  Dehydration secondary to failure to thrive.  3.  Vascular dementia secondary to recent  cerebrovascular accident.  4.  Type 2 diabetes.  5.  Coronary artery disease.   PLAN:  The patient is being admitted to the regular medical floor. He will be placed on telemetry. He will be rehydrated with IV fluids. Electrolyte imbalances will be corrected IV. The patient will be evaluated by both speech and physical therapy. It is anticipated that the patient will eventually need placement.   CODE STATUS:  The patient is a NO CODE IN THAT HE HAS A LIVING WILL.   PROGNOSIS:  Poor.    ____________________________ Letta PateJohn B. Danne HarborWalker III, MD jbw:dmm D: 09/17/2012 19:25:32 ET T: 09/17/2012 20:24:22 ET JOB#: [id number]  cc: Letta PateJohn B. Danne HarborWalker III, MD, <Dictator> Elmo PuttJOHN B WALKER III MD ELECTRONICALLY SIGNED 09/18/2012 10:55
